# Patient Record
Sex: Male | Born: 1955 | Race: White | Hispanic: No | Marital: Married | Smoking: Never smoker
Health system: Southern US, Community
[De-identification: ages and names within clinical notes are randomized; demographics above are authoritative.]

## PROBLEM LIST (undated history)

## (undated) DIAGNOSIS — M1990 Unspecified osteoarthritis, unspecified site: Secondary | ICD-10-CM

## (undated) DIAGNOSIS — E785 Hyperlipidemia, unspecified: Secondary | ICD-10-CM

## (undated) HISTORY — DX: Hyperlipidemia, unspecified: E78.5

## (undated) HISTORY — PX: WRIST SURGERY: SHX841

## (undated) HISTORY — PX: KNEE SURGERY: SHX244

## (undated) HISTORY — DX: Unspecified osteoarthritis, unspecified site: M19.90

---

## 2015-09-19 DIAGNOSIS — L820 Inflamed seborrheic keratosis: Secondary | ICD-10-CM | POA: Diagnosis not present

## 2015-09-19 DIAGNOSIS — L219 Seborrheic dermatitis, unspecified: Secondary | ICD-10-CM | POA: Diagnosis not present

## 2015-09-19 DIAGNOSIS — L578 Other skin changes due to chronic exposure to nonionizing radiation: Secondary | ICD-10-CM | POA: Diagnosis not present

## 2015-09-19 DIAGNOSIS — L570 Actinic keratosis: Secondary | ICD-10-CM | POA: Diagnosis not present

## 2015-11-18 DIAGNOSIS — J208 Acute bronchitis due to other specified organisms: Secondary | ICD-10-CM | POA: Diagnosis not present

## 2015-11-18 DIAGNOSIS — Z6828 Body mass index (BMI) 28.0-28.9, adult: Secondary | ICD-10-CM | POA: Diagnosis not present

## 2016-02-24 DIAGNOSIS — R0789 Other chest pain: Secondary | ICD-10-CM | POA: Diagnosis not present

## 2016-02-24 DIAGNOSIS — J181 Lobar pneumonia, unspecified organism: Secondary | ICD-10-CM | POA: Diagnosis not present

## 2016-02-24 DIAGNOSIS — R079 Chest pain, unspecified: Secondary | ICD-10-CM | POA: Diagnosis not present

## 2016-02-24 DIAGNOSIS — R0602 Shortness of breath: Secondary | ICD-10-CM | POA: Diagnosis not present

## 2016-02-27 DIAGNOSIS — R0789 Other chest pain: Secondary | ICD-10-CM | POA: Diagnosis not present

## 2016-02-27 DIAGNOSIS — J189 Pneumonia, unspecified organism: Secondary | ICD-10-CM | POA: Diagnosis not present

## 2016-03-09 DIAGNOSIS — J189 Pneumonia, unspecified organism: Secondary | ICD-10-CM | POA: Diagnosis not present

## 2016-03-17 DIAGNOSIS — Z6827 Body mass index (BMI) 27.0-27.9, adult: Secondary | ICD-10-CM | POA: Diagnosis not present

## 2016-03-17 DIAGNOSIS — J181 Lobar pneumonia, unspecified organism: Secondary | ICD-10-CM | POA: Diagnosis not present

## 2016-03-17 DIAGNOSIS — R918 Other nonspecific abnormal finding of lung field: Secondary | ICD-10-CM | POA: Diagnosis not present

## 2016-04-14 DIAGNOSIS — M545 Low back pain: Secondary | ICD-10-CM | POA: Diagnosis not present

## 2016-04-14 DIAGNOSIS — L570 Actinic keratosis: Secondary | ICD-10-CM | POA: Diagnosis not present

## 2016-04-14 DIAGNOSIS — M546 Pain in thoracic spine: Secondary | ICD-10-CM | POA: Diagnosis not present

## 2016-04-14 DIAGNOSIS — L821 Other seborrheic keratosis: Secondary | ICD-10-CM | POA: Diagnosis not present

## 2016-04-14 DIAGNOSIS — L578 Other skin changes due to chronic exposure to nonionizing radiation: Secondary | ICD-10-CM | POA: Diagnosis not present

## 2016-04-14 DIAGNOSIS — M62522 Muscle wasting and atrophy, not elsewhere classified, left upper arm: Secondary | ICD-10-CM | POA: Diagnosis not present

## 2016-04-20 DIAGNOSIS — M546 Pain in thoracic spine: Secondary | ICD-10-CM | POA: Diagnosis not present

## 2016-04-20 DIAGNOSIS — M62522 Muscle wasting and atrophy, not elsewhere classified, left upper arm: Secondary | ICD-10-CM | POA: Diagnosis not present

## 2016-04-20 DIAGNOSIS — M545 Low back pain: Secondary | ICD-10-CM | POA: Diagnosis not present

## 2016-04-24 DIAGNOSIS — M545 Low back pain: Secondary | ICD-10-CM | POA: Diagnosis not present

## 2016-04-24 DIAGNOSIS — M62522 Muscle wasting and atrophy, not elsewhere classified, left upper arm: Secondary | ICD-10-CM | POA: Diagnosis not present

## 2016-04-24 DIAGNOSIS — M546 Pain in thoracic spine: Secondary | ICD-10-CM | POA: Diagnosis not present

## 2016-04-27 DIAGNOSIS — M545 Low back pain: Secondary | ICD-10-CM | POA: Diagnosis not present

## 2016-04-27 DIAGNOSIS — M546 Pain in thoracic spine: Secondary | ICD-10-CM | POA: Diagnosis not present

## 2016-04-27 DIAGNOSIS — M62522 Muscle wasting and atrophy, not elsewhere classified, left upper arm: Secondary | ICD-10-CM | POA: Diagnosis not present

## 2016-05-01 DIAGNOSIS — M545 Low back pain: Secondary | ICD-10-CM | POA: Diagnosis not present

## 2016-05-01 DIAGNOSIS — M62522 Muscle wasting and atrophy, not elsewhere classified, left upper arm: Secondary | ICD-10-CM | POA: Diagnosis not present

## 2016-05-01 DIAGNOSIS — M546 Pain in thoracic spine: Secondary | ICD-10-CM | POA: Diagnosis not present

## 2016-06-09 DIAGNOSIS — M62830 Muscle spasm of back: Secondary | ICD-10-CM | POA: Diagnosis not present

## 2016-06-09 DIAGNOSIS — M9902 Segmental and somatic dysfunction of thoracic region: Secondary | ICD-10-CM | POA: Diagnosis not present

## 2016-06-09 DIAGNOSIS — S29019A Strain of muscle and tendon of unspecified wall of thorax, initial encounter: Secondary | ICD-10-CM | POA: Diagnosis not present

## 2016-06-09 DIAGNOSIS — M9903 Segmental and somatic dysfunction of lumbar region: Secondary | ICD-10-CM | POA: Diagnosis not present

## 2016-06-10 DIAGNOSIS — M9902 Segmental and somatic dysfunction of thoracic region: Secondary | ICD-10-CM | POA: Diagnosis not present

## 2016-06-10 DIAGNOSIS — S29019A Strain of muscle and tendon of unspecified wall of thorax, initial encounter: Secondary | ICD-10-CM | POA: Diagnosis not present

## 2016-06-10 DIAGNOSIS — M62830 Muscle spasm of back: Secondary | ICD-10-CM | POA: Diagnosis not present

## 2016-06-10 DIAGNOSIS — M9903 Segmental and somatic dysfunction of lumbar region: Secondary | ICD-10-CM | POA: Diagnosis not present

## 2016-06-15 DIAGNOSIS — M62830 Muscle spasm of back: Secondary | ICD-10-CM | POA: Diagnosis not present

## 2016-06-15 DIAGNOSIS — M9902 Segmental and somatic dysfunction of thoracic region: Secondary | ICD-10-CM | POA: Diagnosis not present

## 2016-06-15 DIAGNOSIS — M9903 Segmental and somatic dysfunction of lumbar region: Secondary | ICD-10-CM | POA: Diagnosis not present

## 2016-06-15 DIAGNOSIS — S29019A Strain of muscle and tendon of unspecified wall of thorax, initial encounter: Secondary | ICD-10-CM | POA: Diagnosis not present

## 2016-06-16 DIAGNOSIS — L578 Other skin changes due to chronic exposure to nonionizing radiation: Secondary | ICD-10-CM | POA: Diagnosis not present

## 2016-06-16 DIAGNOSIS — L821 Other seborrheic keratosis: Secondary | ICD-10-CM | POA: Diagnosis not present

## 2016-06-16 DIAGNOSIS — L570 Actinic keratosis: Secondary | ICD-10-CM | POA: Diagnosis not present

## 2016-06-18 DIAGNOSIS — M9902 Segmental and somatic dysfunction of thoracic region: Secondary | ICD-10-CM | POA: Diagnosis not present

## 2016-06-18 DIAGNOSIS — M62830 Muscle spasm of back: Secondary | ICD-10-CM | POA: Diagnosis not present

## 2016-06-18 DIAGNOSIS — S29019A Strain of muscle and tendon of unspecified wall of thorax, initial encounter: Secondary | ICD-10-CM | POA: Diagnosis not present

## 2016-06-18 DIAGNOSIS — M9903 Segmental and somatic dysfunction of lumbar region: Secondary | ICD-10-CM | POA: Diagnosis not present

## 2016-06-23 DIAGNOSIS — S29019A Strain of muscle and tendon of unspecified wall of thorax, initial encounter: Secondary | ICD-10-CM | POA: Diagnosis not present

## 2016-06-23 DIAGNOSIS — M62830 Muscle spasm of back: Secondary | ICD-10-CM | POA: Diagnosis not present

## 2016-06-23 DIAGNOSIS — M9902 Segmental and somatic dysfunction of thoracic region: Secondary | ICD-10-CM | POA: Diagnosis not present

## 2016-06-23 DIAGNOSIS — M9903 Segmental and somatic dysfunction of lumbar region: Secondary | ICD-10-CM | POA: Diagnosis not present

## 2016-06-25 DIAGNOSIS — S29019A Strain of muscle and tendon of unspecified wall of thorax, initial encounter: Secondary | ICD-10-CM | POA: Diagnosis not present

## 2016-06-25 DIAGNOSIS — M9903 Segmental and somatic dysfunction of lumbar region: Secondary | ICD-10-CM | POA: Diagnosis not present

## 2016-06-25 DIAGNOSIS — M9902 Segmental and somatic dysfunction of thoracic region: Secondary | ICD-10-CM | POA: Diagnosis not present

## 2016-06-25 DIAGNOSIS — M62830 Muscle spasm of back: Secondary | ICD-10-CM | POA: Diagnosis not present

## 2016-06-29 DIAGNOSIS — M9902 Segmental and somatic dysfunction of thoracic region: Secondary | ICD-10-CM | POA: Diagnosis not present

## 2016-06-29 DIAGNOSIS — M62830 Muscle spasm of back: Secondary | ICD-10-CM | POA: Diagnosis not present

## 2016-06-29 DIAGNOSIS — M9903 Segmental and somatic dysfunction of lumbar region: Secondary | ICD-10-CM | POA: Diagnosis not present

## 2016-06-29 DIAGNOSIS — S29019A Strain of muscle and tendon of unspecified wall of thorax, initial encounter: Secondary | ICD-10-CM | POA: Diagnosis not present

## 2016-07-06 DIAGNOSIS — S29019A Strain of muscle and tendon of unspecified wall of thorax, initial encounter: Secondary | ICD-10-CM | POA: Diagnosis not present

## 2016-07-06 DIAGNOSIS — M9902 Segmental and somatic dysfunction of thoracic region: Secondary | ICD-10-CM | POA: Diagnosis not present

## 2016-07-06 DIAGNOSIS — M62830 Muscle spasm of back: Secondary | ICD-10-CM | POA: Diagnosis not present

## 2016-07-06 DIAGNOSIS — M9903 Segmental and somatic dysfunction of lumbar region: Secondary | ICD-10-CM | POA: Diagnosis not present

## 2016-07-13 DIAGNOSIS — S29019A Strain of muscle and tendon of unspecified wall of thorax, initial encounter: Secondary | ICD-10-CM | POA: Diagnosis not present

## 2016-07-13 DIAGNOSIS — M9903 Segmental and somatic dysfunction of lumbar region: Secondary | ICD-10-CM | POA: Diagnosis not present

## 2016-07-13 DIAGNOSIS — M62830 Muscle spasm of back: Secondary | ICD-10-CM | POA: Diagnosis not present

## 2016-07-13 DIAGNOSIS — M9902 Segmental and somatic dysfunction of thoracic region: Secondary | ICD-10-CM | POA: Diagnosis not present

## 2016-07-20 DIAGNOSIS — M9903 Segmental and somatic dysfunction of lumbar region: Secondary | ICD-10-CM | POA: Diagnosis not present

## 2016-07-20 DIAGNOSIS — M62830 Muscle spasm of back: Secondary | ICD-10-CM | POA: Diagnosis not present

## 2016-07-20 DIAGNOSIS — M9902 Segmental and somatic dysfunction of thoracic region: Secondary | ICD-10-CM | POA: Diagnosis not present

## 2016-07-20 DIAGNOSIS — S29019A Strain of muscle and tendon of unspecified wall of thorax, initial encounter: Secondary | ICD-10-CM | POA: Diagnosis not present

## 2016-08-03 DIAGNOSIS — M62830 Muscle spasm of back: Secondary | ICD-10-CM | POA: Diagnosis not present

## 2016-08-03 DIAGNOSIS — S29019A Strain of muscle and tendon of unspecified wall of thorax, initial encounter: Secondary | ICD-10-CM | POA: Diagnosis not present

## 2016-08-03 DIAGNOSIS — M9902 Segmental and somatic dysfunction of thoracic region: Secondary | ICD-10-CM | POA: Diagnosis not present

## 2016-08-03 DIAGNOSIS — M9903 Segmental and somatic dysfunction of lumbar region: Secondary | ICD-10-CM | POA: Diagnosis not present

## 2016-08-06 DIAGNOSIS — L570 Actinic keratosis: Secondary | ICD-10-CM | POA: Diagnosis not present

## 2016-08-06 DIAGNOSIS — L300 Nummular dermatitis: Secondary | ICD-10-CM | POA: Diagnosis not present

## 2016-08-25 DIAGNOSIS — M9902 Segmental and somatic dysfunction of thoracic region: Secondary | ICD-10-CM | POA: Diagnosis not present

## 2016-08-25 DIAGNOSIS — M9903 Segmental and somatic dysfunction of lumbar region: Secondary | ICD-10-CM | POA: Diagnosis not present

## 2016-10-07 DIAGNOSIS — I861 Scrotal varices: Secondary | ICD-10-CM | POA: Diagnosis not present

## 2016-10-07 DIAGNOSIS — N50811 Right testicular pain: Secondary | ICD-10-CM | POA: Diagnosis not present

## 2016-10-07 DIAGNOSIS — R1031 Right lower quadrant pain: Secondary | ICD-10-CM | POA: Diagnosis not present

## 2016-10-08 DIAGNOSIS — I861 Scrotal varices: Secondary | ICD-10-CM | POA: Diagnosis not present

## 2016-10-12 DIAGNOSIS — Z1389 Encounter for screening for other disorder: Secondary | ICD-10-CM | POA: Diagnosis not present

## 2016-10-12 DIAGNOSIS — Z0000 Encounter for general adult medical examination without abnormal findings: Secondary | ICD-10-CM | POA: Diagnosis not present

## 2016-10-12 DIAGNOSIS — Z23 Encounter for immunization: Secondary | ICD-10-CM | POA: Diagnosis not present

## 2016-10-12 DIAGNOSIS — Z6828 Body mass index (BMI) 28.0-28.9, adult: Secondary | ICD-10-CM | POA: Diagnosis not present

## 2016-10-12 DIAGNOSIS — E785 Hyperlipidemia, unspecified: Secondary | ICD-10-CM | POA: Diagnosis not present

## 2016-10-19 DIAGNOSIS — N401 Enlarged prostate with lower urinary tract symptoms: Secondary | ICD-10-CM | POA: Diagnosis not present

## 2016-10-19 DIAGNOSIS — I861 Scrotal varices: Secondary | ICD-10-CM | POA: Diagnosis not present

## 2016-10-19 DIAGNOSIS — N451 Epididymitis: Secondary | ICD-10-CM | POA: Diagnosis not present

## 2016-10-19 DIAGNOSIS — N50811 Right testicular pain: Secondary | ICD-10-CM | POA: Diagnosis not present

## 2016-11-02 DIAGNOSIS — N451 Epididymitis: Secondary | ICD-10-CM | POA: Diagnosis not present

## 2016-12-01 DIAGNOSIS — N451 Epididymitis: Secondary | ICD-10-CM | POA: Diagnosis not present

## 2016-12-01 DIAGNOSIS — N401 Enlarged prostate with lower urinary tract symptoms: Secondary | ICD-10-CM | POA: Diagnosis not present

## 2016-12-01 DIAGNOSIS — N50811 Right testicular pain: Secondary | ICD-10-CM | POA: Diagnosis not present

## 2016-12-15 DIAGNOSIS — L853 Xerosis cutis: Secondary | ICD-10-CM | POA: Diagnosis not present

## 2016-12-15 DIAGNOSIS — L578 Other skin changes due to chronic exposure to nonionizing radiation: Secondary | ICD-10-CM | POA: Diagnosis not present

## 2016-12-15 DIAGNOSIS — L570 Actinic keratosis: Secondary | ICD-10-CM | POA: Diagnosis not present

## 2017-03-31 DIAGNOSIS — R972 Elevated prostate specific antigen [PSA]: Secondary | ICD-10-CM | POA: Diagnosis not present

## 2017-03-31 DIAGNOSIS — N401 Enlarged prostate with lower urinary tract symptoms: Secondary | ICD-10-CM | POA: Diagnosis not present

## 2017-06-10 DIAGNOSIS — J208 Acute bronchitis due to other specified organisms: Secondary | ICD-10-CM | POA: Diagnosis not present

## 2017-06-10 DIAGNOSIS — E785 Hyperlipidemia, unspecified: Secondary | ICD-10-CM | POA: Diagnosis not present

## 2017-06-10 DIAGNOSIS — J019 Acute sinusitis, unspecified: Secondary | ICD-10-CM | POA: Diagnosis not present

## 2017-06-11 DIAGNOSIS — E785 Hyperlipidemia, unspecified: Secondary | ICD-10-CM | POA: Diagnosis not present

## 2017-06-21 DIAGNOSIS — L570 Actinic keratosis: Secondary | ICD-10-CM | POA: Diagnosis not present

## 2017-06-21 DIAGNOSIS — L820 Inflamed seborrheic keratosis: Secondary | ICD-10-CM | POA: Diagnosis not present

## 2017-06-21 DIAGNOSIS — L578 Other skin changes due to chronic exposure to nonionizing radiation: Secondary | ICD-10-CM | POA: Diagnosis not present

## 2017-06-21 DIAGNOSIS — L821 Other seborrheic keratosis: Secondary | ICD-10-CM | POA: Diagnosis not present

## 2017-06-21 DIAGNOSIS — C4442 Squamous cell carcinoma of skin of scalp and neck: Secondary | ICD-10-CM | POA: Diagnosis not present

## 2017-08-10 DIAGNOSIS — N451 Epididymitis: Secondary | ICD-10-CM | POA: Diagnosis not present

## 2017-08-10 DIAGNOSIS — N50811 Right testicular pain: Secondary | ICD-10-CM | POA: Diagnosis not present

## 2017-10-01 DIAGNOSIS — N451 Epididymitis: Secondary | ICD-10-CM | POA: Diagnosis not present

## 2017-10-01 DIAGNOSIS — N50811 Right testicular pain: Secondary | ICD-10-CM | POA: Diagnosis not present

## 2017-10-01 DIAGNOSIS — R972 Elevated prostate specific antigen [PSA]: Secondary | ICD-10-CM | POA: Diagnosis not present

## 2017-10-01 DIAGNOSIS — N401 Enlarged prostate with lower urinary tract symptoms: Secondary | ICD-10-CM | POA: Diagnosis not present

## 2017-10-08 DIAGNOSIS — J019 Acute sinusitis, unspecified: Secondary | ICD-10-CM | POA: Diagnosis not present

## 2017-10-08 DIAGNOSIS — E785 Hyperlipidemia, unspecified: Secondary | ICD-10-CM | POA: Diagnosis not present

## 2017-10-08 DIAGNOSIS — R972 Elevated prostate specific antigen [PSA]: Secondary | ICD-10-CM | POA: Diagnosis not present

## 2017-10-15 DIAGNOSIS — N451 Epididymitis: Secondary | ICD-10-CM | POA: Diagnosis not present

## 2017-11-11 DIAGNOSIS — R0602 Shortness of breath: Secondary | ICD-10-CM | POA: Diagnosis not present

## 2017-11-11 DIAGNOSIS — Z6828 Body mass index (BMI) 28.0-28.9, adult: Secondary | ICD-10-CM | POA: Diagnosis not present

## 2017-11-26 DIAGNOSIS — N401 Enlarged prostate with lower urinary tract symptoms: Secondary | ICD-10-CM | POA: Diagnosis not present

## 2017-11-26 DIAGNOSIS — N50811 Right testicular pain: Secondary | ICD-10-CM | POA: Diagnosis not present

## 2017-11-26 DIAGNOSIS — R972 Elevated prostate specific antigen [PSA]: Secondary | ICD-10-CM | POA: Diagnosis not present

## 2017-12-14 ENCOUNTER — Other Ambulatory Visit: Payer: Self-pay

## 2017-12-14 ENCOUNTER — Encounter: Payer: Self-pay | Admitting: Urology

## 2017-12-14 ENCOUNTER — Ambulatory Visit (INDEPENDENT_AMBULATORY_CARE_PROVIDER_SITE_OTHER): Payer: Self-pay | Admitting: Urology

## 2017-12-14 VITALS — BP 138/75 | HR 73 | Ht 68.5 in | Wt 182.9 lb

## 2017-12-14 DIAGNOSIS — N50819 Testicular pain, unspecified: Secondary | ICD-10-CM

## 2017-12-14 DIAGNOSIS — R972 Elevated prostate specific antigen [PSA]: Secondary | ICD-10-CM

## 2017-12-14 LAB — URINALYSIS, COMPLETE
BILIRUBIN UA: NEGATIVE
Glucose, UA: NEGATIVE
Ketones, UA: NEGATIVE
Leukocytes, UA: NEGATIVE
Nitrite, UA: NEGATIVE
Protein, UA: NEGATIVE
RBC UA: NEGATIVE
Specific Gravity, UA: 1.015 (ref 1.005–1.030)
Urobilinogen, Ur: 0.2 mg/dL (ref 0.2–1.0)
pH, UA: 7 (ref 5.0–7.5)

## 2017-12-14 LAB — MICROSCOPIC EXAMINATION
Epithelial Cells (non renal): NONE SEEN /hpf (ref 0–10)
WBC, UA: NONE SEEN /hpf (ref 0–5)

## 2017-12-14 MED ORDER — DIAZEPAM 5 MG PO TABS: 5.0000 mg | ORAL_TABLET | Freq: Once | ORAL | 0 refills | Status: DC | PRN

## 2017-12-14 NOTE — Addendum Note (Signed)
Addended by: Vickki HearingHOMPSON, Angelly Spearing L on: 12/14/2017 04:02 PM   Modules accepted: Orders

## 2017-12-14 NOTE — Progress Notes (Signed)
12/14/2017 9:35 AM   Craige Cotta [birth year] [id number]   CC: Elevated PSA, right testicular pain  HPI: I saw Mr. Hoefle in urology clinic today for elevated PSA as well as right testicular pain.  He is a 62 year old relatively healthy male with a family history of prostate cancer in his uncle that was treated with radiation.  He is married with a significantly younger [place]ino wife.  Regarding the testicular pain, he reports chronic right-sided testicular pain since May that has continued to worsen.  It is worse with ejaculations and motorcycle riding.  It is slightly improved with compression underwear.  He denies any risky sexual behavior or different partners.  He does not think his wife has had any additional partners.  He denies any gross hematuria or difficulty with urination.  He had a negative scrotal ultrasound with his PCP over the summer.  He did have a history of a negative hematuria work-up in his 30s at the [facility].  From my review of the records, he was apparently treated for epididymitis with antibiotics in the past, which did improve his testicular pain, however there are no positive urine cultures on my review.  Regarding his PSA history- his most recent PSA was 5.8, with 11% free.  That was elevated from last check of 5.3 in September 2019, as well as in the 3-4 range in prior years.  He has never undergone a prostate biopsy before.  He denies weight loss or bone pain.  He denies any prior abdominal surgeries or hernia repairs.   PMH: Past Medical History:  Diagnosis Date  . DJD (degenerative joint disease)   . Hyperlipidemia     Surgical History: Past Surgical History:  Procedure Laterality Date  . KNEE SURGERY    . WRIST SURGERY       Allergies: No Known Allergies  Family History: Family History  Problem Relation Age of Onset  . Prostate cancer Neg Hx   . Kidney cancer Neg Hx   . Bladder Cancer Neg Hx     Social History:  reports that he has  never smoked. He has never used smokeless tobacco. He reports that he drank alcohol. He reports that he does not use drugs.  ROS: Please see flowsheet from today's date for complete review of systems.  Physical Exam: BP 138/75   Pulse 73   Ht 5' 8.5" (1.74 m)   Wt 182 lb 14.4 oz (83 kg)   BMI 27.41 kg/m    Constitutional:  Alert and oriented, No acute distress. Cardiovascular: No clubbing, cyanosis, or edema. Respiratory: Normal respiratory effort, no increased work of breathing. GI: Abdomen is soft, nontender, nondistended, no abdominal masses GU: No CVA tenderness, circumcised phallus without lesions, widely patent meatus.  Testicles descended and 15 cc bilaterally, no masses.  Testicles are minimally tender to palpation bilaterally, no epididymal tenderness bilaterally. DRE: 40 g, smooth, no nodules or masses Lymph: No cervical or inguinal lymphadenopathy. Skin: No rashes, bruises or suspicious lesions. Neurologic: Grossly intact, no focal deficits, moving all 4 extremities. Psychiatric: Normal mood and affect.  Laboratory Data: PSA 5.8, 11% free Urinalysis today 0 WBCs, 0-2 RBCs, moderate bacteria, nitrite negative   Assessment & Plan:   In summary, Mr. Leta Jungling is a 62 year old healthy male with 6 months of worsening right-sided scrotal pain, as well as elevated PSA to 5.8, with 11% free.  Regarding his scrotal pain I recommended STD testing, and 3 weeks of scheduled NSAIDs if infection work-up is negative.  We also discussed the importance of snug fitting underwear, rest, avoiding motorcycle riding, and icing as needed.  We reviewed the implications of an elevated PSA and the uncertainty surrounding it. In general, a man's PSA increases with age and is produced by both normal and cancerous prostate tissue. The differential diagnosis for elevated PSA includes BPH, prostate cancer, infection, recent intercourse/ejaculation, recent urethroscopic manipulation (foley  placement/cystoscopy) or trauma, and prostatitis.   Management of an elevated PSA can include observation or prostate biopsy and we discussed this in detail. Our goal is to detect clinically significant prostate cancers, and manage with either active surveillance, surgery, or radiation for localized disease. Risks of prostate biopsy include bleeding, infection (including life threatening sepsis), pain, and lower urinary symptoms. Hematuria, hematospermia, and blood in the stool are all common after biopsy and can persist up to 4 weeks.   Follow-up for prostate biopsy Follow-up STD testing  Sondra ComeBrian C Alexsandro Salek, MD  [facility] [address], [address] [place], [place] [address] [phone]

## 2017-12-15 LAB — GC/CHLAMYDIA PROBE AMP
Chlamydia trachomatis, NAA: NEGATIVE
Neisseria gonorrhoeae by PCR: NEGATIVE

## 2017-12-15 LAB — HIV ANTIBODY (ROUTINE TESTING W REFLEX): HIV Screen 4th Generation wRfx: NONREACTIVE

## 2017-12-16 LAB — URINE CULTURE

## 2017-12-21 ENCOUNTER — Telehealth: Payer: Self-pay | Admitting: Urology

## 2017-12-21 NOTE — Telephone Encounter (Signed)
Pt called to let you know he had a pain in left groin area that moved to his left side now.  Both sides are sore, left side is really sore.  He wanted to make Dr Richardo HanksSninsky aware.  He has upcoming biopsy in 1/20.  Please call pt.  Pain is at base of penis near his boys, per the pt.

## 2017-12-22 NOTE — Telephone Encounter (Signed)
Please review note below and advise.

## 2017-12-23 NOTE — Telephone Encounter (Signed)
He should keep appointment for scheduled biopsy.  Continue the anti-inflammatories prescribed at last visit.  If pain continues we will have to consider referral to pelvic floor physical therapy, or try other medications.  Legrand RamsBrian Rozalynn Buege, MD 12/23/2017

## 2017-12-23 NOTE — Telephone Encounter (Signed)
Patient notified and voiced understanding.

## 2017-12-27 ENCOUNTER — Telehealth: Payer: Self-pay | Admitting: Urology

## 2017-12-27 DIAGNOSIS — L820 Inflamed seborrheic keratosis: Secondary | ICD-10-CM | POA: Diagnosis not present

## 2017-12-27 DIAGNOSIS — L821 Other seborrheic keratosis: Secondary | ICD-10-CM | POA: Diagnosis not present

## 2017-12-27 DIAGNOSIS — L578 Other skin changes due to chronic exposure to nonionizing radiation: Secondary | ICD-10-CM | POA: Diagnosis not present

## 2017-12-27 DIAGNOSIS — L570 Actinic keratosis: Secondary | ICD-10-CM | POA: Diagnosis not present

## 2017-12-27 NOTE — Telephone Encounter (Signed)
Pt calling office asking for his PSA results drawn at his last visit/appt. Pt doesn't understand why no one has called him with results and it's not posted in his MyChart. Pt states his PSA wasn't drawn at his appt, was called and asked to come back to office to get PSA drawn. Please advise pt of results at [phone].

## 2017-12-27 NOTE — Telephone Encounter (Signed)
Spoke with patient regarding lab questions.  Advised him the only blood work done at his appointment was for HIV.  PSA was not tested.  Also answered questions patient had regarding biopsy.

## 2017-12-29 ENCOUNTER — Emergency Department
Admission: EM | Admit: 2017-12-29 | Discharge: 2017-12-29 | Disposition: A | Discharge status: Home / Self Care | Payer: BLUE CROSS/BLUE SHIELD | Attending: Emergency Medicine | Admitting: Emergency Medicine

## 2017-12-29 ENCOUNTER — Encounter: Payer: Self-pay | Admitting: Emergency Medicine

## 2017-12-29 ENCOUNTER — Emergency Department: Payer: BLUE CROSS/BLUE SHIELD

## 2017-12-29 DIAGNOSIS — R102 Pelvic and perineal pain: Secondary | ICD-10-CM | POA: Diagnosis not present

## 2017-12-29 DIAGNOSIS — Z79899 Other long term (current) drug therapy: Secondary | ICD-10-CM | POA: Diagnosis not present

## 2017-12-29 DIAGNOSIS — N410 Acute prostatitis: Secondary | ICD-10-CM | POA: Diagnosis not present

## 2017-12-29 DIAGNOSIS — K6289 Other specified diseases of anus and rectum: Secondary | ICD-10-CM | POA: Diagnosis not present

## 2017-12-29 LAB — COMPREHENSIVE METABOLIC PANEL
ALK PHOS: 67 U/L (ref 38–126)
ALT: 21 U/L (ref 0–44)
AST: 19 U/L (ref 15–41)
Albumin: 4.4 g/dL (ref 3.5–5.0)
Anion gap: 7 (ref 5–15)
BUN: 13 mg/dL (ref 6–20)
CALCIUM: 9.3 mg/dL (ref 8.9–10.3)
CO2: 28 mmol/L (ref 22–32)
CREATININE: 0.99 mg/dL (ref 0.61–1.24)
Chloride: 104 mmol/L (ref 98–111)
GFR calc Af Amer: >60 mL/min (ref >60)
GFR calc non Af Amer: >60 mL/min (ref >60)
Glucose, Bld: 100 mg/dL — ABNORMAL HIGH (ref 70–99)
Potassium: 4.1 mmol/L (ref 3.5–5.1)
Sodium: 139 mmol/L (ref 135–145)
TOTAL PROTEIN: 7.6 g/dL (ref 6.5–8.1)
Total Bilirubin: 0.9 mg/dL (ref 0.3–1.2)

## 2017-12-29 LAB — CBC WITH DIFFERENTIAL/PLATELET
Abs Immature Granulocytes: 0.03 10*3/uL (ref 0.00–0.07)
Basophils Absolute: 0.1 10*3/uL (ref 0.0–0.1)
Basophils Relative: 1 %
Eosinophils Absolute: 1 10*3/uL — ABNORMAL HIGH (ref 0.0–0.5)
Eosinophils Relative: 11 %
HCT: 44.4 % (ref 39.0–52.0)
Hemoglobin: 14.9 g/dL (ref 13.0–17.0)
Immature Granulocytes: 0 %
Lymphocytes Relative: 35 %
Lymphs Abs: 3 10*3/uL (ref 0.7–4.0)
MCH: 30.5 pg (ref 26.0–34.0)
MCHC: 33.6 g/dL (ref 30.0–36.0)
MCV: 91 fL (ref 80.0–100.0)
Monocytes Absolute: 0.6 10*3/uL (ref 0.1–1.0)
Monocytes Relative: 7 %
Neutro Abs: 4 10*3/uL (ref 1.7–7.7)
Neutrophils Relative %: 46 %
Platelets: 265 10*3/uL (ref 150–400)
RBC: 4.88 MIL/uL (ref 4.22–5.81)
RDW: 12 % (ref 11.5–15.5)
WBC: 8.8 10*3/uL (ref 4.0–10.5)
nRBC: 0 % (ref 0.0–0.2)

## 2017-12-29 LAB — URINALYSIS, COMPLETE (UACMP) WITH MICROSCOPIC
Bacteria, UA: NONE SEEN
Bilirubin Urine: NEGATIVE
Glucose, UA: NEGATIVE mg/dL
Hgb urine dipstick: NEGATIVE
Ketones, ur: NEGATIVE mg/dL
Leukocytes, UA: NEGATIVE
Nitrite: NEGATIVE
Protein, ur: NEGATIVE mg/dL
SPECIFIC GRAVITY, URINE: 1.005 (ref 1.005–1.030)
Squamous Epithelial / HPF: NONE SEEN (ref 0–5)
WBC, UA: NONE SEEN WBC/hpf (ref 0–5)
pH: 7 (ref 5.0–8.0)

## 2017-12-29 MED ORDER — CIPROFLOXACIN IN D5W 400 MG/200ML IV SOLN
400.0000 mg | Freq: Once | INTRAVENOUS | Status: AC
  Administered 2017-12-29: 400 mg via INTRAVENOUS
  Filled 2017-12-29: qty 200

## 2017-12-29 MED ORDER — IOPAMIDOL (ISOVUE-300) INJECTION 61%
100.0000 mL | Freq: Once | INTRAVENOUS | Status: AC | PRN
  Administered 2017-12-29: 100 mL via INTRAVENOUS

## 2017-12-29 MED ORDER — MORPHINE SULFATE (PF) 4 MG/ML IV SOLN
4.0000 mg | Freq: Once | INTRAVENOUS | Status: AC
  Administered 2017-12-29: 4 mg via INTRAVENOUS
  Filled 2017-12-29: qty 1

## 2017-12-29 MED ORDER — IOPAMIDOL (ISOVUE-300) INJECTION 61%
30.0000 mL | Freq: Once | INTRAVENOUS | Status: AC | PRN
  Administered 2017-12-29: 30 mL via ORAL

## 2017-12-29 MED ORDER — CIPROFLOXACIN HCL 500 MG PO TABS: 500.0000 mg | ORAL_TABLET | Freq: Two times a day (BID) | ORAL | 0 refills | Status: AC

## 2017-12-29 MED ORDER — SODIUM CHLORIDE 0.9 % IV BOLUS
1000.0000 mL | Freq: Once | INTRAVENOUS | Status: AC
  Administered 2017-12-29: 1000 mL via INTRAVENOUS

## 2017-12-29 MED ORDER — HYDROCODONE-ACETAMINOPHEN 5-325 MG PO TABS: 1.0000 | ORAL_TABLET | Freq: Four times a day (QID) | ORAL | 0 refills | Status: DC | PRN

## 2017-12-29 NOTE — ED Notes (Signed)
MD at bedside to discuss CT findings. Cipro started. VSS. Will continue to monitor.

## 2017-12-29 NOTE — Discharge Instructions (Addendum)
Continue motrin for pain   Take vicodin for severe pain   Take cipro twice daily for 2 weeks for prostatitis   You should follow up with urology again. Consider biopsy after you finish antibiotics   Return to ER if you have worse rectal pain, trouble urinating, fever

## 2017-12-29 NOTE — ED Notes (Signed)
Patient to be discharged with follow up instructions.

## 2017-12-29 NOTE — ED Notes (Signed)
Assumed  Acre of patient aox4, reports sudden onset of pain to groin area, denies urinary issues or hematuria. NS infusing . Patient of unit to CT. Will monitor upon return.

## 2017-12-29 NOTE — ED Provider Notes (Signed)
[facility] EMERGENCY DEPARTMENT Provider Note   CSN: [id number] Arrival date & time: 12/29/17  0932     History   Chief Complaint Chief Complaint  Patient presents with  . Groin Pain    HPI RAYSON RANDO is a 62 y.o. male history of hyperlipidemia, elevated PSA who presenting with rectal pain.  Patient states that this he had elevated PSA for the last several months.  He saw Dr. Richardo Hanks from [facility] on 12/3 and was diagnosed with possible prostatitis and started on anti-inflammatories.  However, patient states that they have not been helping.  Patient is scheduled for prostate biopsy next month but states that he has constant pain for 2 to 3 weeks so was unable to wait that long.  Of note, patient did have negative HIV test and negative GC and chlamydia testing while at the urologist office.  Patient states that he is married for many years and denies any urinary symptoms or penile discharge.  The history is provided by the patient.    Past Medical History:  Diagnosis Date  . DJD (degenerative joint disease)   . Hyperlipidemia     There are no active problems to display for this patient.   Past Surgical History:  Procedure Laterality Date  . KNEE SURGERY    . WRIST SURGERY          Home Medications    Prior to Admission medications   Medication Sig Start Date End Date Taking? Authorizing Provider  atorvastatin (LIPITOR) 20 MG tablet Take 20 mg by mouth daily at 6 PM.  09/11/17  Yes [provider]  ibuprofen (ADVIL,MOTRIN) 200 MG tablet Take 200 mg by mouth every 6 (six) hours as needed.   Yes [provider]  loratadine (CLARITIN) 10 MG tablet Take 10 mg by mouth 2 (two) times daily.   Yes [provider]  Misc Natural Products (URINOZINC PO) Take 1 capsule by mouth 2 (two) times daily.   Yes [provider]  diazepam (VALIUM) 5 MG tablet Take 1 tablet (5 mg total) by mouth once as needed for up  to 1 dose for anxiety. Take 30 minutes prior to prostate biopsy Patient not taking: Reported on 12/29/2017 12/14/17   Sondra Come, MD    Family History Family History  Problem Relation Age of Onset  . Prostate cancer Neg Hx   . Kidney cancer Neg Hx   . Bladder Cancer Neg Hx     Social History Social History   Tobacco Use  . Smoking status: Never Smoker  . Smokeless tobacco: Never Used  Substance Use Topics  . Alcohol use: Not Currently  . Drug use: Never     Allergies   Patient has no known allergies.   Review of Systems Review of Systems  Gastrointestinal: Positive for rectal pain.  All other systems reviewed and are negative.    Physical Exam Updated Vital Signs BP 125/75 (BP Location: Right Arm)   Pulse 72   Temp 98.4 F (36.9 C) (Oral)   Resp 18   Ht 5\' 8"  (1.727 m)   Wt 81.6 kg   SpO2 97%   BMI 27.37 kg/m   Physical Exam Vitals signs and nursing note reviewed.  Constitutional:      Comments: Uncomfortable, pacing in the room   HENT:     Head: Normocephalic.     Nose: Nose normal.     Mouth/Throat:     Mouth: Mucous membranes are  moist.  Eyes:     Extraocular Movements: Extraocular movements intact.     Pupils: Pupils are equal, round, and reactive to light.  Neck:     Musculoskeletal: Normal range of motion.  Cardiovascular:     Rate and Rhythm: Normal rate.  Pulmonary:     Effort: Pulmonary effort is normal.     Breath sounds: Normal breath sounds.  Abdominal:     General: Abdomen is flat.     Palpations: Abdomen is soft.  Genitourinary:    Penis: Normal.      Scrotum/Testes: Normal.     Comments: No penile discharge. No testicular tenderness. Rectal- ? Small hemorrhoid that is not thrombosed. No obvious prostate tenderness  Musculoskeletal: Normal range of motion.  Skin:    General: Skin is warm.     Capillary Refill: Capillary refill takes less than 2 seconds.  Neurological:     General: No focal deficit present.     Mental  Status: He is oriented to person, place, and time.  Psychiatric:        Mood and Affect: Mood normal.        Behavior: Behavior normal.      ED Treatments / Results  Labs (all labs ordered are listed, but only abnormal results are displayed) Labs Reviewed  CBC WITH DIFFERENTIAL/PLATELET - Abnormal; Notable for the following components:      Result Value   Eosinophils Absolute 1.0 (*)    All other components within normal limits  COMPREHENSIVE METABOLIC PANEL - Abnormal; Notable for the following components:   Glucose, Bld 100 (*)    All other components within normal limits  URINALYSIS, COMPLETE (UACMP) WITH MICROSCOPIC - Abnormal; Notable for the following components:   Color, Urine COLORLESS (*)    APPearance CLEAR (*)    All other components within normal limits  URINE CULTURE    EKG None  Radiology Ct Abdomen Pelvis W Contrast  Result Date: 12/29/2017 CLINICAL DATA:  Pelvic pain EXAM: CT ABDOMEN AND PELVIS WITH CONTRAST TECHNIQUE: Multidetector CT imaging of the abdomen and pelvis was performed using the standard protocol following bolus administration of intravenous contrast. CONTRAST:  100mL ISOVUE-300 IOPAMIDOL (ISOVUE-300) INJECTION 61% COMPARISON:  None. FINDINGS: Lower chest: No acute abnormality. Hepatobiliary: No focal liver abnormality is seen. No gallstones, gallbladder wall thickening, or biliary dilatation. Pancreas: Unremarkable. No pancreatic ductal dilatation or surrounding inflammatory changes. Spleen: Normal in size without focal abnormality. Adrenals/Urinary Tract: Adrenal glands are within normal limits. Kidneys are well visualize without evidence of renal calculi or obstructive changes. Normal excretion is noted bilaterally. The ureters are within normal limits. The bladder is partially distended. Stomach/Bowel: The appendix is within normal limits. Colon is unremarkable. No small bowel obstructive changes are seen. The stomach is decompressed.  Vascular/Lymphatic: Aortic atherosclerosis. No enlarged abdominal or pelvic lymph nodes. Reproductive: Prostate is well visualized and mildly prominent scattered areas of decreased attenuation are noted within which may represent some underlying inflammatory change. This would correspond with the patient's given clinical history. Mild prominence of the seminal vesicle on the left is noted also likely related to inflammatory change. Other: No abdominal wall hernia or abnormality. No abdominopelvic ascites. Musculoskeletal: No acute or significant osseous findings. IMPRESSION: Heterogeneity and prominence of the prostate and seminal vesicle on the left likely related to prostatitis. Electronically Signed   By: Alcide CleverMark  Lukens M.D.   On: 12/29/2017 11:48    Procedures Procedures (including critical care time)  Medications Ordered in ED Medications  ciprofloxacin (  CIPRO) IVPB 400 mg (400 mg Intravenous New Bag/Given 12/29/17 1207)  sodium chloride 0.9 % bolus 1,000 mL (1,000 mLs Intravenous New Bag/Given 12/29/17 1030)  morphine 4 MG/ML injection 4 mg (4 mg Intravenous Given 12/29/17 1103)  iopamidol (ISOVUE-300) 61 % injection 30 mL (30 mLs Oral Contrast Given 12/29/17 1016)  iopamidol (ISOVUE-300) 61 % injection 100 mL (100 mLs Intravenous Contrast Given 12/29/17 1130)     Initial Impression / Assessment and Plan / ED Course  I have reviewed the triage vital signs and the nursing notes.  Pertinent labs & imaging results that were available during my care of the patient were reviewed by me and considered in my medical decision making (see chart for details).    PLEASANT BENSINGER is a 62 y.o. male here with rectal pain. Has elevated PSA but no biopsy was performed. Will get labs, UA, CT ab/pel to further assess. No scrotal tenderness to suggest torsion   12:30 PM WBC nl. CT showed possible prostatitis. UA normal. He did finish levaquin and augmentin in the summer for previous prostatitis. I wonder if  he has abacterial prostatitis or not. Given that he had new symptoms, will try 2 weeks of cipro and refer him back to [facility] urology for follow up   Final Clinical Impressions(s) / ED Diagnoses   Final diagnoses:  None    ED Discharge Orders    None       Charlynne Pander, MD 12/29/17 1231

## 2017-12-29 NOTE — ED Triage Notes (Signed)
Pt states "My prostate is on fire". Pt reports has been seeing someone and they checked a psa level that was elevated. Pt reports has an appointment at the end of December. Pt reports he can not deal with pain. Pt reports they though at one point he had epididmytis. Pt is scheduled for a biopsy in January. Pt reports painful to sit.

## 2017-12-29 NOTE — ED Notes (Signed)
Patient discharged to home, VSS< IV hep lock d/c

## 2017-12-30 LAB — URINE CULTURE: Culture: NO GROWTH

## 2018-01-17 ENCOUNTER — Encounter: Payer: Self-pay | Admitting: Urology

## 2018-01-17 ENCOUNTER — Other Ambulatory Visit: Payer: Self-pay | Admitting: Urology

## 2018-01-17 ENCOUNTER — Ambulatory Visit (INDEPENDENT_AMBULATORY_CARE_PROVIDER_SITE_OTHER): Payer: BLUE CROSS/BLUE SHIELD | Admitting: Urology

## 2018-01-17 VITALS — BP 164/80 | HR 94 | Ht 68.5 in | Wt 183.9 lb

## 2018-01-17 DIAGNOSIS — R972 Elevated prostate specific antigen [PSA]: Secondary | ICD-10-CM

## 2018-01-17 MED ORDER — LEVOFLOXACIN 500 MG PO TABS
500.0000 mg | ORAL_TABLET | Freq: Once | ORAL | Status: AC
  Administered 2018-01-17: 500 mg via ORAL

## 2018-01-17 MED ORDER — HYDROCODONE-ACETAMINOPHEN 5-325 MG PO TABS: 1.0000 | ORAL_TABLET | Freq: Four times a day (QID) | ORAL | 0 refills | Status: AC | PRN

## 2018-01-17 MED ORDER — GENTAMICIN SULFATE 40 MG/ML IJ SOLN
80.0000 mg | Freq: Once | INTRAMUSCULAR | Status: AC
  Administered 2018-01-17: 80 mg via INTRAMUSCULAR

## 2018-01-17 NOTE — Addendum Note (Signed)
Addended by: Sondra Come on: 01/17/2018 04:54 PM   Modules accepted: Orders

## 2018-01-17 NOTE — Progress Notes (Signed)
   01/17/18  Indication:  Elevated PSA, 5.8. Also has history of chronic testicular and perineal pain.  Prostate Biopsy Procedure   Informed consent was obtained, and we discussed the risks of bleeding and infection/sepsis. A time out was performed to ensure correct patient identity.  Pre-Procedure: - Last PSA Level: 5.8 - Gentamicin and levaquin given for antibiotic prophylaxis - Transrectal Ultrasound performed revealing a 56 gm prostate, PSA density 0.10 - No significant hypoechoic or median lobe noted  Procedure: - Prostate block performed using 10 cc 1% lidocaine and biopsies taken from sextant areas, a total of 12 under ultrasound guidance.  Post-Procedure: - Patient tolerated the procedure well - He was counseled to seek immediate medical attention if experiences significant bleeding, fevers, or severe pain - Return in one week to discuss biopsy results  Assessment/ Plan: Will follow up in 1-2 weeks to discuss pathology  Consider Amitryptiline or gabapentin for pelvic pain if biopsy negative  Legrand RamsBrian Sninsky, MD 01/17/2018

## 2018-01-20 ENCOUNTER — Other Ambulatory Visit: Payer: Self-pay | Admitting: Urology

## 2018-01-20 ENCOUNTER — Telehealth: Payer: Self-pay

## 2018-01-20 LAB — PATHOLOGY REPORT

## 2018-01-20 NOTE — Progress Notes (Signed)
Please let him know his prostate biopsy was negative and showed no cancer cells.  This is great news.  He should keep his scheduled follow-up with me to discuss his chronic pelvic and testicular pain.  Legrand Rams, MD 01/20/2018

## 2018-01-20 NOTE — Telephone Encounter (Signed)
-----   Message from Sondra Come, MD sent at 01/20/2018 12:44 PM EST -----   ----- Message ----- From: Johnanna Schneiders Sent: 01/20/2018  11:46 AM EST To: Sondra Come, MD

## 2018-01-31 ENCOUNTER — Encounter: Payer: Self-pay | Admitting: Urology

## 2018-01-31 ENCOUNTER — Ambulatory Visit (INDEPENDENT_AMBULATORY_CARE_PROVIDER_SITE_OTHER): Payer: BLUE CROSS/BLUE SHIELD | Admitting: Urology

## 2018-01-31 VITALS — BP 113/68 | HR 76 | Ht 69.0 in | Wt 183.0 lb

## 2018-01-31 DIAGNOSIS — R102 Pelvic and perineal pain: Secondary | ICD-10-CM | POA: Diagnosis not present

## 2018-01-31 MED ORDER — AMITRIPTYLINE HCL 50 MG PO TABS: 50.0000 mg | ORAL_TABLET | Freq: Every day | ORAL | 3 refills | Status: AC

## 2018-01-31 NOTE — Patient Instructions (Signed)
Pelvic Pain, Male  Pelvic pain is pain in your lower abdomen, below your belly button and between your hips. The pain may start suddenly (be acute), keep coming back (recur), or last a long time (become chronic). Pelvic pain that lasts longer than six months is considered chronic. There are many possible causes of pelvic pain. Sometimes, the cause is not known.  Pelvic pain may affect your:  · Prostate gland.  · Urinary system.  · Digestive tract.  · Musculoskeletal system. Strained muscles or ligaments may cause pelvic pain.  Follow these instructions at home:    Medicines  · Take over-the-counter and prescription medicines only as told by your health care provider.  · If you were prescribed an antibiotic medicine, take it as told by your health care provider. Do not stop taking the antibiotic even if you start to feel better.  Managing pain, stiffness, and swelling    · Take warm water baths (sitz baths). Sitz baths help with relaxing your pelvic floor muscles.  ? For a sitz bath, the water only comes up to your hips and covers your buttocks. A sitz bath may done at home in a bathtub or with a portable sitz bath that fits over the toilet.  · If directed, apply heat to the affected area before you exercise. Use the heat source that your health care provider recommends, such as a moist heat pack or a heating pad.  ? Place a towel between your skin and the heat source.  ? Leave the heat on for 20-30 minutes.  ? Remove the heat if your skin turns bright red. This is especially important if you are unable to feel pain, heat, or cold. You may have a greater risk of getting burned.  General instructions  · Rest as told by your health care provider.  · Keep a journal of your pelvic pain. Write down:  ? When the pain started.  ? Where the pain is located.  ? What seems to make the pain better or worse.  ? Any symptoms you have along with the pain.  · Follow your treatment plan as told by your health care provider. This may  include:  ? Pelvic physical therapy.  ? Yoga, meditation, and exercise.  ? Biofeedback. This process trains you to manage your body's response (physiological response) through breathing techniques and relaxation methods. You will work with a therapist while machines are used to monitor your physical symptoms.  ? Acupuncture. This is a type of treatment that involves stimulating specific points on your body by inserting thin needles through your skin to treat pain.  · Keep all follow-up visits as told by your health care provider. This is important.  Contact a health care provider if:  · Medicine does not help your pain.  · Your pain comes back.  · You have new symptoms.  · You have a fever or chills.  · You are constipated.  · You have blood in your urine or stool.  · You feel weak or light-headed.  Get help right away if:  · You have sudden severe pain.  · Your pain steadily gets worse.  · You have severe pain along with fever, nausea, vomiting, or excessive sweating.  Summary  · Pelvic pain is pain in your lower abdomen, below your belly button and between your hips. There are many possible causes of pelvic pain. Sometimes, the cause is not known.  · Take over-the-counter and prescription medicines only as told   by your health care provider. If you were prescribed an antibiotic medicine, take it as told by your health care provider. Do not stop taking the antibiotic even if you start to feel better.  · Contact a health care provider if you have new or worsening symptoms.  · Get help right away if you have severe pain along with fever, nausea, vomiting, or excessive sweating.  · Keep all follow-up visits as told by your health care provider. This is important.  This information is not intended to replace advice given to you by your health care provider. Make sure you discuss any questions you have with your health care provider.  Document Released: 09/23/2011 Document Revised: 05/19/2017 Document Reviewed:  05/19/2017  Elsevier Interactive Patient Education © 2019 [employer]

## 2018-01-31 NOTE — Progress Notes (Signed)
   01/31/2018 3:48 PM   MARISA LATHE 11/16/1955 [id number]  Reason for visit: Follow up prostate biopsy results, chronic pelvic pain  I saw Mr. Labat in urology clinic today to discuss his prostate biopsy results and chronic pelvic pain.  He underwent prostate biopsy on 01/17/2018 for an elevated PSA of 5.8 which demonstrated a 56g prostate(PSA density 0.10), which showed only benign prostate tissue with no malignancy.  There was no prostatic inflammation seen.  His primary complaint is chronic pelvic pain behind the scrotum.  He also previously had pain that alternated between his left and right testicle.  He has had negative work-up so far with no prior positive urine cultures, a negative scrotal ultrasound over the summer, and a negative CT scan in December 2019.    Continue yearly PSA screening Trial of 50 mg amitriptyline daily for pelvic pain Referral to pelvic floor physical therapy RTC 6 to 8 weeks for symptom check, consider either gabapentin or 30-day course of antibiotics for possible prostatitis at that time  A total of 10 minutes were spent face-to-face with the patient, greater than 50% was spent in patient education, counseling, and coordination of care regarding negative prostate biopsy results, need for continued PSA screening, and strategies for pelvic pain.   Sondra Come, MD  [facility] [address] [place], [place] [address] [phone]

## 2018-03-15 DIAGNOSIS — N50819 Testicular pain, unspecified: Secondary | ICD-10-CM | POA: Diagnosis not present

## 2018-03-15 DIAGNOSIS — R972 Elevated prostate specific antigen [PSA]: Secondary | ICD-10-CM | POA: Diagnosis not present

## 2018-03-28 ENCOUNTER — Ambulatory Visit: Payer: BLUE CROSS/BLUE SHIELD | Admitting: Urology

## 2018-04-07 ENCOUNTER — Ambulatory Visit: Payer: BLUE CROSS/BLUE SHIELD | Admitting: Urology

## 2018-04-19 DIAGNOSIS — I861 Scrotal varices: Secondary | ICD-10-CM | POA: Diagnosis not present

## 2018-05-23 DIAGNOSIS — L301 Dyshidrosis [pompholyx]: Secondary | ICD-10-CM | POA: Diagnosis not present

## 2018-05-23 DIAGNOSIS — L821 Other seborrheic keratosis: Secondary | ICD-10-CM | POA: Diagnosis not present

## 2018-05-23 DIAGNOSIS — L570 Actinic keratosis: Secondary | ICD-10-CM | POA: Diagnosis not present

## 2018-05-23 DIAGNOSIS — L578 Other skin changes due to chronic exposure to nonionizing radiation: Secondary | ICD-10-CM | POA: Diagnosis not present

## 2018-11-03 DIAGNOSIS — E785 Hyperlipidemia, unspecified: Secondary | ICD-10-CM | POA: Diagnosis not present

## 2018-11-03 DIAGNOSIS — Z23 Encounter for immunization: Secondary | ICD-10-CM | POA: Diagnosis not present

## 2018-11-03 DIAGNOSIS — Z0000 Encounter for general adult medical examination without abnormal findings: Secondary | ICD-10-CM | POA: Diagnosis not present

## 2018-11-03 DIAGNOSIS — Z1331 Encounter for screening for depression: Secondary | ICD-10-CM | POA: Diagnosis not present

## 2018-11-07 DIAGNOSIS — H10022 Other mucopurulent conjunctivitis, left eye: Secondary | ICD-10-CM | POA: Diagnosis not present

## 2018-11-28 DIAGNOSIS — L578 Other skin changes due to chronic exposure to nonionizing radiation: Secondary | ICD-10-CM | POA: Diagnosis not present

## 2018-11-28 DIAGNOSIS — L570 Actinic keratosis: Secondary | ICD-10-CM | POA: Diagnosis not present

## 2018-11-28 DIAGNOSIS — L821 Other seborrheic keratosis: Secondary | ICD-10-CM | POA: Diagnosis not present

## 2018-12-27 DIAGNOSIS — L578 Other skin changes due to chronic exposure to nonionizing radiation: Secondary | ICD-10-CM | POA: Diagnosis not present

## 2018-12-27 DIAGNOSIS — L820 Inflamed seborrheic keratosis: Secondary | ICD-10-CM | POA: Diagnosis not present

## 2018-12-27 DIAGNOSIS — L570 Actinic keratosis: Secondary | ICD-10-CM | POA: Diagnosis not present

## 2019-01-04 DIAGNOSIS — K611 Rectal abscess: Secondary | ICD-10-CM | POA: Diagnosis not present

## 2019-05-08 DIAGNOSIS — Z6827 Body mass index (BMI) 27.0-27.9, adult: Secondary | ICD-10-CM | POA: Diagnosis not present

## 2019-05-08 DIAGNOSIS — R972 Elevated prostate specific antigen [PSA]: Secondary | ICD-10-CM | POA: Diagnosis not present

## 2019-05-08 DIAGNOSIS — E785 Hyperlipidemia, unspecified: Secondary | ICD-10-CM | POA: Diagnosis not present

## 2019-05-16 DIAGNOSIS — R972 Elevated prostate specific antigen [PSA]: Secondary | ICD-10-CM | POA: Diagnosis not present

## 2019-06-05 DIAGNOSIS — L570 Actinic keratosis: Secondary | ICD-10-CM | POA: Diagnosis not present

## 2019-06-05 DIAGNOSIS — L720 Epidermal cyst: Secondary | ICD-10-CM | POA: Diagnosis not present

## 2019-06-05 DIAGNOSIS — L300 Nummular dermatitis: Secondary | ICD-10-CM | POA: Diagnosis not present

## 2019-06-05 DIAGNOSIS — L578 Other skin changes due to chronic exposure to nonionizing radiation: Secondary | ICD-10-CM | POA: Diagnosis not present

## 2019-06-05 DIAGNOSIS — D225 Melanocytic nevi of trunk: Secondary | ICD-10-CM | POA: Diagnosis not present

## 2019-06-19 DIAGNOSIS — Z86010 Personal history of colonic polyps: Secondary | ICD-10-CM | POA: Diagnosis not present

## 2019-06-19 DIAGNOSIS — Z1211 Encounter for screening for malignant neoplasm of colon: Secondary | ICD-10-CM | POA: Diagnosis not present

## 2019-06-27 DIAGNOSIS — R972 Elevated prostate specific antigen [PSA]: Secondary | ICD-10-CM | POA: Diagnosis not present

## 2019-07-11 IMAGING — CT CT ABD-PELV W/ CM
2 of 5 series · 16 of 46 positions shown, 18 images · IV contrast (APPLIED)
Comparison: None.

CLINICAL DATA: Pelvic pain

EXAM:
CT ABDOMEN AND PELVIS WITH CONTRAST
TECHNIQUE: Multidetector CT imaging of the abdomen and pelvis was performed
using the standard protocol following bolus administration of
intravenous contrast.
CONTRAST:  100mL OMSMYM-CLL IOPAMIDOL (OMSMYM-CLL) INJECTION 61%

[Series 2: routine abd/pel with · axial · 0.74mm/px · z∈[-1112,-688]mm · 13 of 97 slices shown, 15 images]
[im 6/97  soft-tissue]
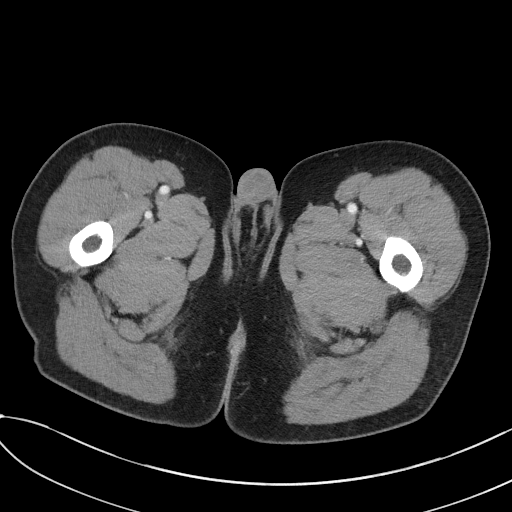
[im 6/97  bone]
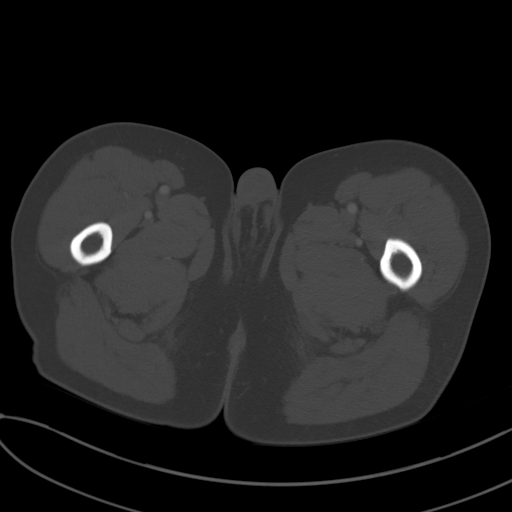
[im 16/97  soft-tissue]
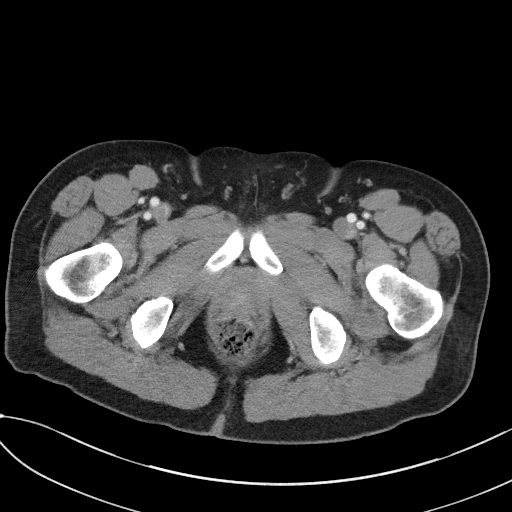
[im 21/97  soft-tissue]
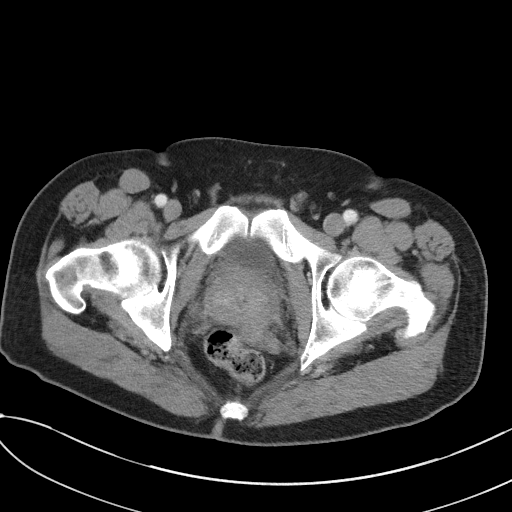
[im 26/97  soft-tissue]
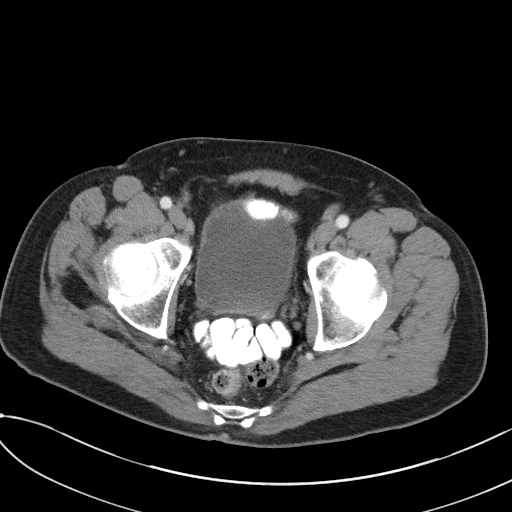
[im 36/97  soft-tissue]
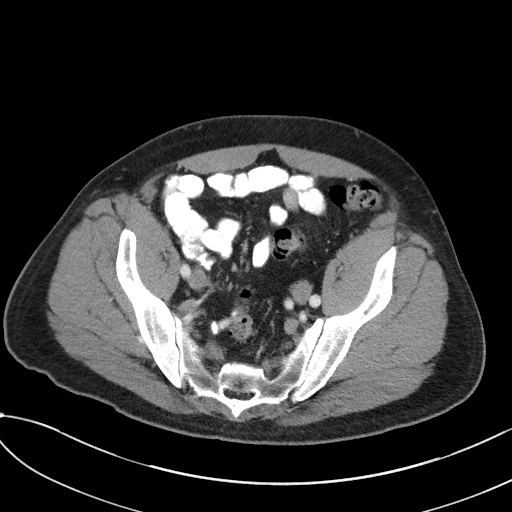
[im 41/97  soft-tissue]
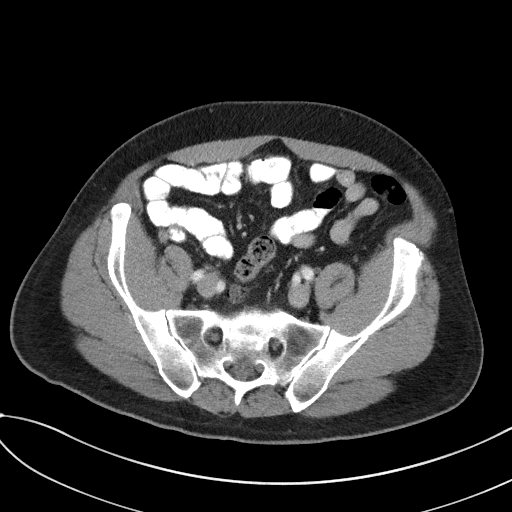
[im 51/97  soft-tissue]
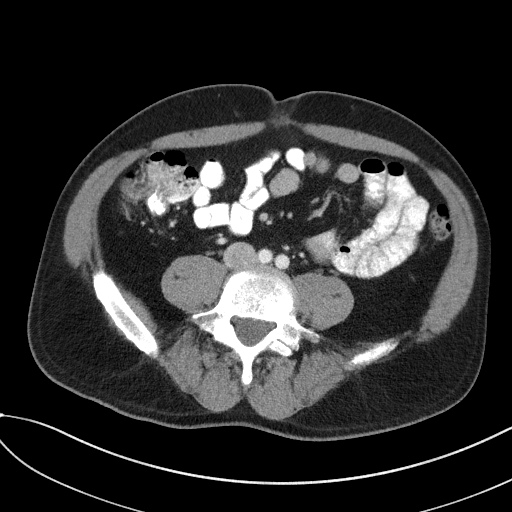
[im 56/97  soft-tissue]
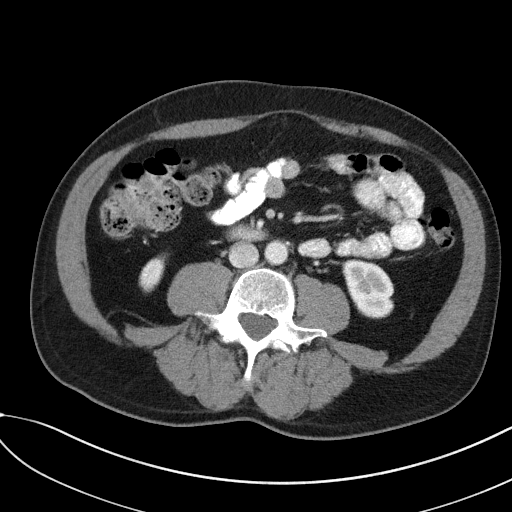
[im 61/97  soft-tissue]
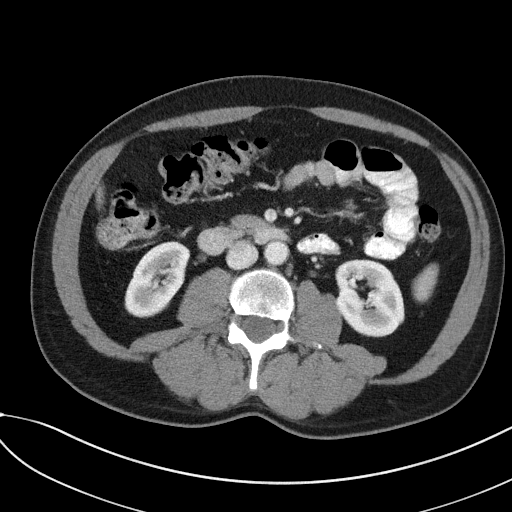
[im 61/97  bone]
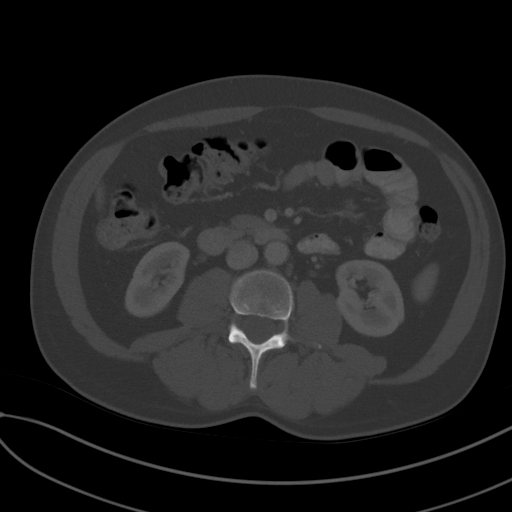
[im 71/97  soft-tissue]
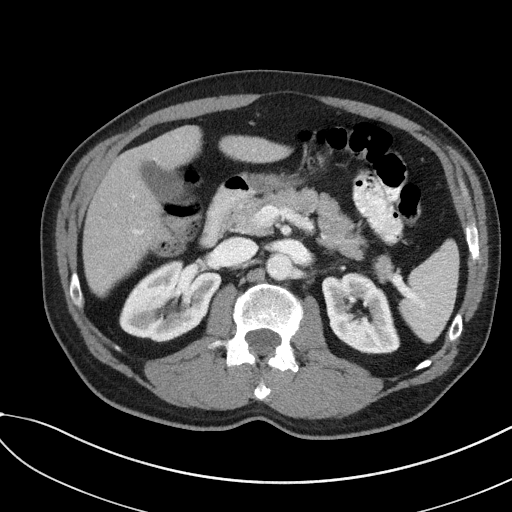
[im 76/97  soft-tissue]
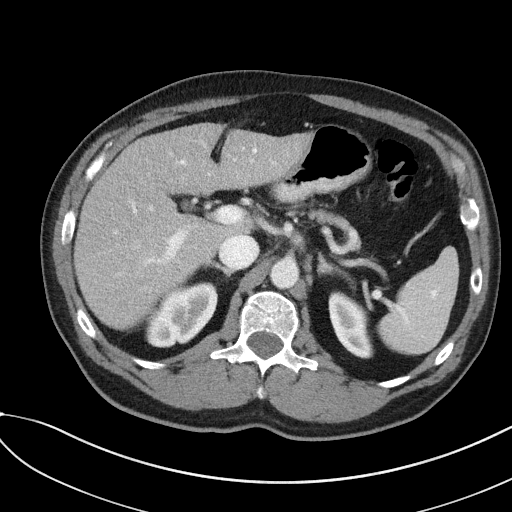
[im 81/97  soft-tissue]
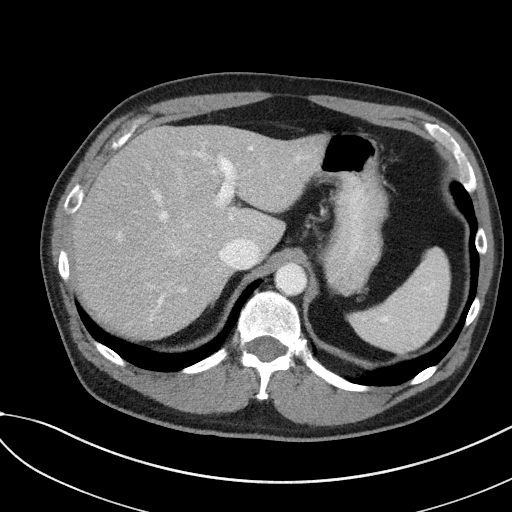
[im 91/97  soft-tissue]
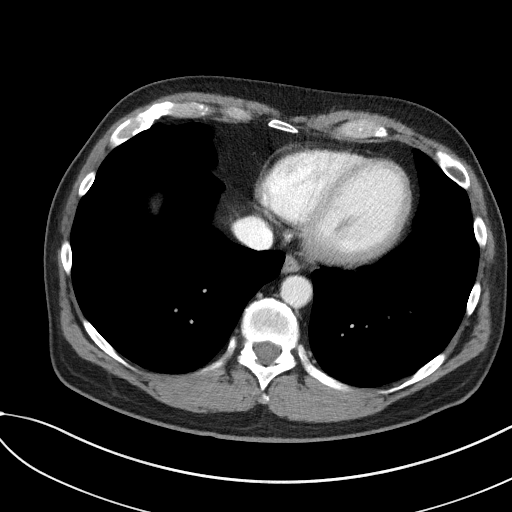

[Series 5: coronal st · coronal · 0.72mm/px · 3 of 88 slices shown]
[im 30/88  soft-tissue]
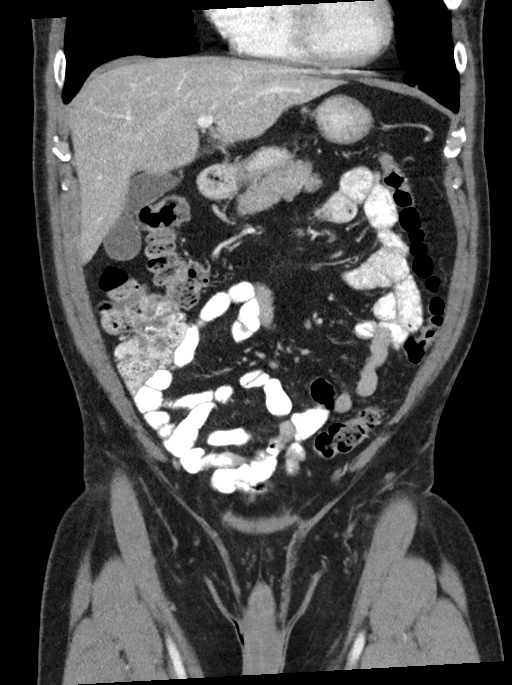
[im 39/88  soft-tissue]
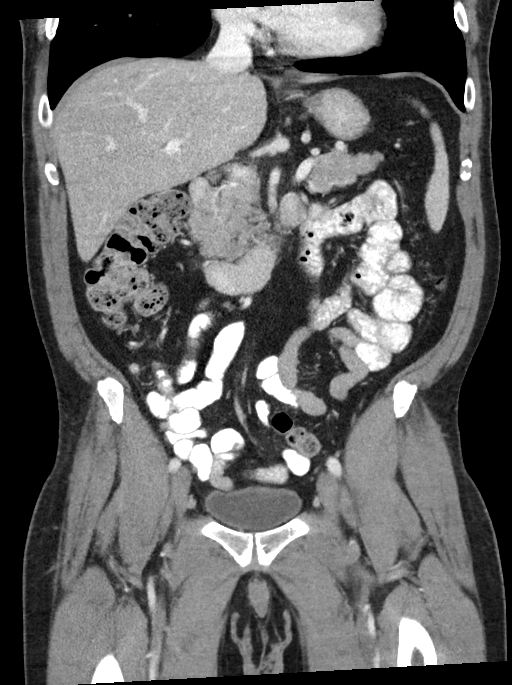
[im 49/88  soft-tissue]
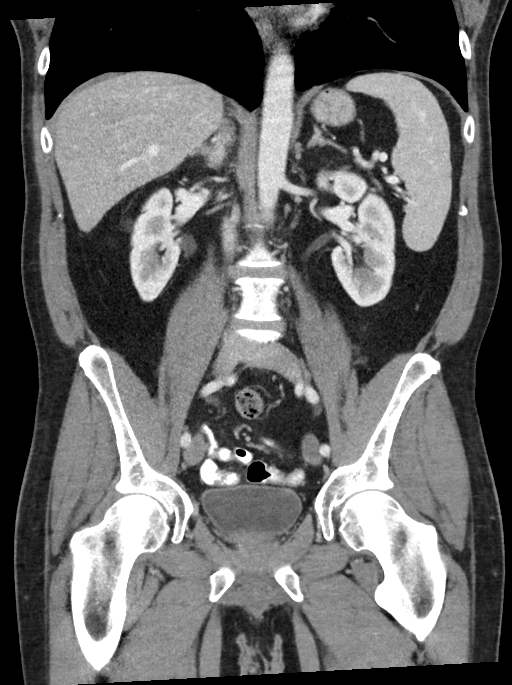

[16 of 46 positions shown; findings below may reference images not displayed]

FINDINGS: Lower chest: No acute abnormality.

Hepatobiliary: No focal liver abnormality is seen. No gallstones,
gallbladder wall thickening, or biliary dilatation.

Pancreas: Unremarkable. No pancreatic ductal dilatation or
surrounding inflammatory changes.

Spleen: Normal in size without focal abnormality.

Adrenals/Urinary Tract: Adrenal glands are within normal limits.
Kidneys are well visualize without evidence of renal calculi or
obstructive changes. Normal excretion is noted bilaterally. The
ureters are within normal limits. The bladder is partially
distended.

Stomach/Bowel: The appendix is within normal limits. Colon is
unremarkable. No small bowel obstructive changes are seen. The
stomach is decompressed.

Vascular/Lymphatic: Aortic atherosclerosis. No enlarged abdominal or
pelvic lymph nodes.

Reproductive: Prostate is well visualized and mildly prominent
scattered areas of decreased attenuation are noted within which may
represent some underlying inflammatory change. This would correspond
with the patient's given clinical history. Mild prominence of the
seminal vesicle on the left is noted also likely related to
inflammatory change.

Other: No abdominal wall hernia or abnormality. No abdominopelvic
ascites.

Musculoskeletal: No acute or significant osseous findings.
IMPRESSION: Heterogeneity and prominence of the prostate and seminal vesicle on
the left likely related to prostatitis.

## 2019-11-08 DIAGNOSIS — Z1331 Encounter for screening for depression: Secondary | ICD-10-CM | POA: Diagnosis not present

## 2019-11-08 DIAGNOSIS — Z0000 Encounter for general adult medical examination without abnormal findings: Secondary | ICD-10-CM | POA: Diagnosis not present

## 2019-11-08 DIAGNOSIS — Z23 Encounter for immunization: Secondary | ICD-10-CM | POA: Diagnosis not present

## 2019-11-14 DIAGNOSIS — R972 Elevated prostate specific antigen [PSA]: Secondary | ICD-10-CM | POA: Diagnosis not present

## 2019-12-11 DIAGNOSIS — L570 Actinic keratosis: Secondary | ICD-10-CM | POA: Diagnosis not present

## 2019-12-11 DIAGNOSIS — L578 Other skin changes due to chronic exposure to nonionizing radiation: Secondary | ICD-10-CM | POA: Diagnosis not present

## 2019-12-11 DIAGNOSIS — L821 Other seborrheic keratosis: Secondary | ICD-10-CM | POA: Diagnosis not present

## 2019-12-11 DIAGNOSIS — L219 Seborrheic dermatitis, unspecified: Secondary | ICD-10-CM | POA: Diagnosis not present

## 2019-12-11 DIAGNOSIS — L820 Inflamed seborrheic keratosis: Secondary | ICD-10-CM | POA: Diagnosis not present

## 2020-01-25 DIAGNOSIS — Z20822 Contact with and (suspected) exposure to covid-19: Secondary | ICD-10-CM | POA: Diagnosis not present

## 2020-01-25 DIAGNOSIS — B9689 Other specified bacterial agents as the cause of diseases classified elsewhere: Secondary | ICD-10-CM | POA: Diagnosis not present

## 2020-01-25 DIAGNOSIS — J019 Acute sinusitis, unspecified: Secondary | ICD-10-CM | POA: Diagnosis not present

## 2020-01-25 DIAGNOSIS — R6889 Other general symptoms and signs: Secondary | ICD-10-CM | POA: Diagnosis not present

## 2020-02-01 DIAGNOSIS — R972 Elevated prostate specific antigen [PSA]: Secondary | ICD-10-CM | POA: Diagnosis not present

## 2020-02-01 DIAGNOSIS — N419 Inflammatory disease of prostate, unspecified: Secondary | ICD-10-CM | POA: Diagnosis not present

## 2020-02-01 DIAGNOSIS — N410 Acute prostatitis: Secondary | ICD-10-CM | POA: Diagnosis not present

## 2020-02-01 DIAGNOSIS — N411 Chronic prostatitis: Secondary | ICD-10-CM | POA: Diagnosis not present

## 2020-02-09 DIAGNOSIS — R972 Elevated prostate specific antigen [PSA]: Secondary | ICD-10-CM | POA: Diagnosis not present

## 2020-03-11 DIAGNOSIS — L570 Actinic keratosis: Secondary | ICD-10-CM | POA: Diagnosis not present

## 2020-03-11 DIAGNOSIS — L821 Other seborrheic keratosis: Secondary | ICD-10-CM | POA: Diagnosis not present

## 2020-03-11 DIAGNOSIS — L578 Other skin changes due to chronic exposure to nonionizing radiation: Secondary | ICD-10-CM | POA: Diagnosis not present

## 2020-05-08 DIAGNOSIS — E785 Hyperlipidemia, unspecified: Secondary | ICD-10-CM | POA: Diagnosis not present

## 2020-05-08 DIAGNOSIS — R972 Elevated prostate specific antigen [PSA]: Secondary | ICD-10-CM | POA: Diagnosis not present

## 2020-10-14 DIAGNOSIS — R972 Elevated prostate specific antigen [PSA]: Secondary | ICD-10-CM | POA: Diagnosis not present

## 2020-10-17 DIAGNOSIS — R972 Elevated prostate specific antigen [PSA]: Secondary | ICD-10-CM | POA: Diagnosis not present

## 2020-10-17 DIAGNOSIS — K4090 Unilateral inguinal hernia, without obstruction or gangrene, not specified as recurrent: Secondary | ICD-10-CM | POA: Diagnosis not present

## 2020-11-13 DIAGNOSIS — Z23 Encounter for immunization: Secondary | ICD-10-CM | POA: Diagnosis not present

## 2020-11-13 DIAGNOSIS — Z1331 Encounter for screening for depression: Secondary | ICD-10-CM | POA: Diagnosis not present

## 2020-11-13 DIAGNOSIS — E785 Hyperlipidemia, unspecified: Secondary | ICD-10-CM | POA: Diagnosis not present

## 2020-11-14 DIAGNOSIS — L570 Actinic keratosis: Secondary | ICD-10-CM | POA: Diagnosis not present

## 2020-11-14 DIAGNOSIS — L578 Other skin changes due to chronic exposure to nonionizing radiation: Secondary | ICD-10-CM | POA: Diagnosis not present

## 2020-11-14 DIAGNOSIS — L821 Other seborrheic keratosis: Secondary | ICD-10-CM | POA: Diagnosis not present

## 2021-01-29 DIAGNOSIS — L03211 Cellulitis of face: Secondary | ICD-10-CM | POA: Diagnosis not present

## 2021-02-19 DIAGNOSIS — R972 Elevated prostate specific antigen [PSA]: Secondary | ICD-10-CM | POA: Diagnosis not present

## 2021-03-12 DIAGNOSIS — R972 Elevated prostate specific antigen [PSA]: Secondary | ICD-10-CM | POA: Diagnosis not present

## 2021-05-12 DIAGNOSIS — L570 Actinic keratosis: Secondary | ICD-10-CM | POA: Diagnosis not present

## 2021-05-12 DIAGNOSIS — L821 Other seborrheic keratosis: Secondary | ICD-10-CM | POA: Diagnosis not present

## 2021-05-12 DIAGNOSIS — L578 Other skin changes due to chronic exposure to nonionizing radiation: Secondary | ICD-10-CM | POA: Diagnosis not present

## 2021-05-14 DIAGNOSIS — R972 Elevated prostate specific antigen [PSA]: Secondary | ICD-10-CM | POA: Diagnosis not present

## 2021-05-14 DIAGNOSIS — Z6828 Body mass index (BMI) 28.0-28.9, adult: Secondary | ICD-10-CM | POA: Diagnosis not present

## 2021-05-14 DIAGNOSIS — Z0000 Encounter for general adult medical examination without abnormal findings: Secondary | ICD-10-CM | POA: Diagnosis not present

## 2021-05-19 DIAGNOSIS — R7301 Impaired fasting glucose: Secondary | ICD-10-CM | POA: Diagnosis not present

## 2021-06-04 DIAGNOSIS — J069 Acute upper respiratory infection, unspecified: Secondary | ICD-10-CM | POA: Diagnosis not present

## 2021-06-24 DIAGNOSIS — R972 Elevated prostate specific antigen [PSA]: Secondary | ICD-10-CM | POA: Diagnosis not present

## 2021-11-17 DIAGNOSIS — E785 Hyperlipidemia, unspecified: Secondary | ICD-10-CM | POA: Diagnosis not present

## 2021-11-17 DIAGNOSIS — Z23 Encounter for immunization: Secondary | ICD-10-CM | POA: Diagnosis not present

## 2021-11-17 DIAGNOSIS — Z1331 Encounter for screening for depression: Secondary | ICD-10-CM | POA: Diagnosis not present

## 2021-11-17 DIAGNOSIS — R7301 Impaired fasting glucose: Secondary | ICD-10-CM | POA: Diagnosis not present

## 2021-11-17 DIAGNOSIS — R972 Elevated prostate specific antigen [PSA]: Secondary | ICD-10-CM | POA: Diagnosis not present

## 2022-01-20 DIAGNOSIS — R972 Elevated prostate specific antigen [PSA]: Secondary | ICD-10-CM | POA: Diagnosis not present

## 2022-02-18 DIAGNOSIS — J3489 Other specified disorders of nose and nasal sinuses: Secondary | ICD-10-CM | POA: Diagnosis not present

## 2022-02-19 DIAGNOSIS — L578 Other skin changes due to chronic exposure to nonionizing radiation: Secondary | ICD-10-CM | POA: Diagnosis not present

## 2022-02-19 DIAGNOSIS — L570 Actinic keratosis: Secondary | ICD-10-CM | POA: Diagnosis not present

## 2022-03-11 DIAGNOSIS — M1711 Unilateral primary osteoarthritis, right knee: Secondary | ICD-10-CM | POA: Diagnosis not present

## 2022-03-11 DIAGNOSIS — M25561 Pain in right knee: Secondary | ICD-10-CM | POA: Diagnosis not present

## 2022-03-23 DIAGNOSIS — L570 Actinic keratosis: Secondary | ICD-10-CM | POA: Diagnosis not present

## 2022-04-28 DIAGNOSIS — R972 Elevated prostate specific antigen [PSA]: Secondary | ICD-10-CM | POA: Diagnosis not present

## 2022-05-06 DIAGNOSIS — R972 Elevated prostate specific antigen [PSA]: Secondary | ICD-10-CM | POA: Diagnosis not present

## 2022-05-10 DIAGNOSIS — B9689 Other specified bacterial agents as the cause of diseases classified elsewhere: Secondary | ICD-10-CM | POA: Diagnosis not present

## 2022-05-10 DIAGNOSIS — J019 Acute sinusitis, unspecified: Secondary | ICD-10-CM | POA: Diagnosis not present

## 2022-05-18 DIAGNOSIS — E785 Hyperlipidemia, unspecified: Secondary | ICD-10-CM | POA: Diagnosis not present

## 2022-05-18 DIAGNOSIS — Z6827 Body mass index (BMI) 27.0-27.9, adult: Secondary | ICD-10-CM | POA: Diagnosis not present

## 2022-07-14 DIAGNOSIS — R972 Elevated prostate specific antigen [PSA]: Secondary | ICD-10-CM | POA: Diagnosis not present

## 2022-09-24 DIAGNOSIS — L578 Other skin changes due to chronic exposure to nonionizing radiation: Secondary | ICD-10-CM | POA: Diagnosis not present

## 2022-09-24 DIAGNOSIS — L570 Actinic keratosis: Secondary | ICD-10-CM | POA: Diagnosis not present

## 2022-09-24 DIAGNOSIS — L820 Inflamed seborrheic keratosis: Secondary | ICD-10-CM | POA: Diagnosis not present

## 2022-09-24 DIAGNOSIS — L821 Other seborrheic keratosis: Secondary | ICD-10-CM | POA: Diagnosis not present

## 2022-11-18 DIAGNOSIS — Z9181 History of falling: Secondary | ICD-10-CM | POA: Diagnosis not present

## 2022-11-18 DIAGNOSIS — S61001A Unspecified open wound of right thumb without damage to nail, initial encounter: Secondary | ICD-10-CM | POA: Diagnosis not present

## 2022-11-18 DIAGNOSIS — R972 Elevated prostate specific antigen [PSA]: Secondary | ICD-10-CM | POA: Diagnosis not present

## 2022-11-18 DIAGNOSIS — E785 Hyperlipidemia, unspecified: Secondary | ICD-10-CM | POA: Diagnosis not present

## 2022-11-18 DIAGNOSIS — Z23 Encounter for immunization: Secondary | ICD-10-CM | POA: Diagnosis not present

## 2022-11-18 DIAGNOSIS — Z6827 Body mass index (BMI) 27.0-27.9, adult: Secondary | ICD-10-CM | POA: Diagnosis not present

## 2022-11-26 DIAGNOSIS — R972 Elevated prostate specific antigen [PSA]: Secondary | ICD-10-CM | POA: Diagnosis not present

## 2023-03-01 DIAGNOSIS — R972 Elevated prostate specific antigen [PSA]: Secondary | ICD-10-CM | POA: Diagnosis not present

## 2023-03-29 DIAGNOSIS — L578 Other skin changes due to chronic exposure to nonionizing radiation: Secondary | ICD-10-CM | POA: Diagnosis not present

## 2023-03-29 DIAGNOSIS — L821 Other seborrheic keratosis: Secondary | ICD-10-CM | POA: Diagnosis not present

## 2023-03-29 DIAGNOSIS — L570 Actinic keratosis: Secondary | ICD-10-CM | POA: Diagnosis not present

## 2023-04-06 DIAGNOSIS — N401 Enlarged prostate with lower urinary tract symptoms: Secondary | ICD-10-CM | POA: Diagnosis not present

## 2023-04-06 DIAGNOSIS — H6693 Otitis media, unspecified, bilateral: Secondary | ICD-10-CM | POA: Diagnosis not present

## 2023-04-06 DIAGNOSIS — E785 Hyperlipidemia, unspecified: Secondary | ICD-10-CM | POA: Diagnosis not present

## 2023-06-22 DIAGNOSIS — Z139 Encounter for screening, unspecified: Secondary | ICD-10-CM | POA: Diagnosis not present

## 2023-06-22 DIAGNOSIS — R972 Elevated prostate specific antigen [PSA]: Secondary | ICD-10-CM | POA: Diagnosis not present

## 2023-06-22 DIAGNOSIS — E785 Hyperlipidemia, unspecified: Secondary | ICD-10-CM | POA: Diagnosis not present

## 2023-06-29 DIAGNOSIS — R972 Elevated prostate specific antigen [PSA]: Secondary | ICD-10-CM | POA: Diagnosis not present

## 2023-06-29 DIAGNOSIS — N400 Enlarged prostate without lower urinary tract symptoms: Secondary | ICD-10-CM | POA: Diagnosis not present

## 2023-10-04 DIAGNOSIS — L578 Other skin changes due to chronic exposure to nonionizing radiation: Secondary | ICD-10-CM | POA: Diagnosis not present

## 2023-10-04 DIAGNOSIS — L570 Actinic keratosis: Secondary | ICD-10-CM | POA: Diagnosis not present

## 2023-10-04 DIAGNOSIS — L821 Other seborrheic keratosis: Secondary | ICD-10-CM | POA: Diagnosis not present

## 2023-12-08 DIAGNOSIS — Z23 Encounter for immunization: Secondary | ICD-10-CM | POA: Diagnosis not present

## 2024-01-11 DIAGNOSIS — R0981 Nasal congestion: Secondary | ICD-10-CM | POA: Diagnosis not present
